# Patient Record
Sex: Female | Born: 1980 | Race: Black or African American | Hispanic: No | Marital: Married | Smoking: Never smoker
Health system: Southern US, Community
[De-identification: ages and names within clinical notes are randomized; demographics above are authoritative.]

## PROBLEM LIST (undated history)

## (undated) DIAGNOSIS — Z789 Other specified health status: Secondary | ICD-10-CM

## (undated) HISTORY — PX: TONSILLECTOMY: SUR1361

---

## 2009-01-24 ENCOUNTER — Inpatient Hospital Stay (HOSPITAL_COMMUNITY): Admission: AD | Admit: 2009-01-24 | Discharge: 2009-01-24 | Payer: Self-pay | Admitting: Obstetrics and Gynecology

## 2009-02-13 ENCOUNTER — Inpatient Hospital Stay (HOSPITAL_COMMUNITY): Admission: AD | Admit: 2009-02-13 | Discharge: 2009-02-16 | Payer: Self-pay | Admitting: Obstetrics and Gynecology

## 2009-03-22 ENCOUNTER — Ambulatory Visit: Admission: RE | Admit: 2009-03-22 | Discharge: 2009-03-22 | Payer: Self-pay | Admitting: Obstetrics and Gynecology

## 2010-07-26 LAB — TYPE AND SCREEN: Antibody Screen: NEGATIVE

## 2010-07-26 LAB — CBC
HCT: 36 % (ref 36.0–46.0)
HCT: 37.4 % (ref 36.0–46.0)
Hemoglobin: 11.1 g/dL — ABNORMAL LOW (ref 12.0–15.0)
Hemoglobin: 11.9 g/dL — ABNORMAL LOW (ref 12.0–15.0)
Hemoglobin: 12.5 g/dL (ref 12.0–15.0)
MCHC: 33.5 g/dL (ref 30.0–36.0)
MCV: 96.1 fL (ref 78.0–100.0)
Platelets: 102 10*3/uL — ABNORMAL LOW (ref 150–400)
Platelets: 112 10*3/uL — ABNORMAL LOW (ref 150–400)
RBC: 3.44 MIL/uL — ABNORMAL LOW (ref 3.87–5.11)
RBC: 3.76 MIL/uL — ABNORMAL LOW (ref 3.87–5.11)
RBC: 3.83 MIL/uL — ABNORMAL LOW (ref 3.87–5.11)
RDW: 13.3 % (ref 11.5–15.5)
RDW: 13.5 % (ref 11.5–15.5)
RDW: 13.5 % (ref 11.5–15.5)
WBC: 13.8 10*3/uL — ABNORMAL HIGH (ref 4.0–10.5)
WBC: 14 10*3/uL — ABNORMAL HIGH (ref 4.0–10.5)
WBC: 17 10*3/uL — ABNORMAL HIGH (ref 4.0–10.5)

## 2010-07-26 LAB — DIFFERENTIAL
Eosinophils Absolute: 0.1 10*3/uL (ref 0.0–0.7)
Eosinophils Relative: 1 % (ref 0–5)
Lymphs Abs: 1.9 10*3/uL (ref 0.7–4.0)
Monocytes Absolute: 0.7 10*3/uL (ref 0.1–1.0)

## 2010-07-26 LAB — URINE MICROSCOPIC-ADD ON

## 2010-07-26 LAB — URINALYSIS, ROUTINE W REFLEX MICROSCOPIC
Bilirubin Urine: NEGATIVE
Specific Gravity, Urine: <1.005 — ABNORMAL LOW (ref 1.005–1.030)
Urobilinogen, UA: 0.2 mg/dL (ref 0.0–1.0)
pH: 6 (ref 5.0–8.0)

## 2010-07-26 LAB — HEPATITIS B SURFACE ANTIGEN: Hepatitis B Surface Ag: NEGATIVE

## 2010-07-26 LAB — RUBELLA SCREEN: Rubella: 31.1 IU/mL — ABNORMAL HIGH

## 2010-07-26 LAB — ABO/RH: ABO/RH(D): A POS

## 2010-07-26 LAB — PLATELET COUNT: Platelets: 105 10*3/uL — ABNORMAL LOW (ref 150–400)

## 2010-10-17 ENCOUNTER — Inpatient Hospital Stay (HOSPITAL_COMMUNITY)
Admission: AD | Admit: 2010-10-17 | Discharge: 2010-10-17 | Disposition: A | Discharge status: Home / Self Care | Payer: PRIVATE HEALTH INSURANCE | Source: Ambulatory Visit | Attending: Obstetrics and Gynecology | Admitting: Obstetrics and Gynecology

## 2010-10-17 ENCOUNTER — Inpatient Hospital Stay (HOSPITAL_COMMUNITY)
Admission: AD | Admit: 2010-10-17 | Discharge: 2010-10-19 | DRG: 775 | Disposition: A | Discharge status: Home / Self Care | Payer: PRIVATE HEALTH INSURANCE | Source: Ambulatory Visit | Attending: Obstetrics and Gynecology | Admitting: Obstetrics and Gynecology

## 2010-10-17 DIAGNOSIS — O479 False labor, unspecified: Secondary | ICD-10-CM | POA: Insufficient documentation

## 2010-10-18 LAB — CBC
Hemoglobin: 11.4 g/dL — ABNORMAL LOW (ref 12.0–15.0)
MCV: 89.5 fL (ref 78.0–100.0)
Platelets: 132 10*3/uL — ABNORMAL LOW (ref 150–400)
RBC: 3.91 MIL/uL (ref 3.87–5.11)
WBC: 23.7 10*3/uL — ABNORMAL HIGH (ref 4.0–10.5)

## 2010-10-18 LAB — RPR: RPR Ser Ql: NONREACTIVE

## 2010-10-26 ENCOUNTER — Inpatient Hospital Stay (HOSPITAL_COMMUNITY): Admission: AD | Admit: 2010-10-26 | Payer: Self-pay | Source: Ambulatory Visit | Admitting: Obstetrics and Gynecology

## 2010-11-12 NOTE — Discharge Summary (Signed)
Angela Walton, Angela Walton             ACCOUNT NO.:  [id number]  MEDICAL RECORD NO.:  [id number]  LOCATION:  9107                          FACILITY:  [facility]  PHYSICIAN:  Osborn Coho, M.D.   DATE OF BIRTH:  1980/05/31  DATE OF ADMISSION:  10/17/2010 DATE OF DISCHARGE:  10/19/2010                              DISCHARGE SUMMARY   REASON FOR ADMISSION:  Labor.  The patient is a gravida 2, para 1 at 38 weeks and 5 days, presents with urge to push.  PRENATAL PROCEDURES:  None.  ANTEPARTUM PROCEDURES:  None.  POSTPARTUM PROCEDURES:  Routine.  COMPLICATIONS:  None.  HOSPITAL COURSE:  The patient arrived in advanced 2nd stage labor and had a SVD without complications. The patient followed a routine postpartum course without complications and vital signs remained stable.  LABORATORY DATA:  The following day on October 18, 2010, laboratory included white blood cells of 23.7, hemoglobin 11.4, hematocrit 35.0, platelets of 132.  RPR was nonreactive.  DISCHARGE DIAGNOSIS:  Term pregnancy, delivered.  INSTRUCTIONS:  Per [facility] booklet.  MEDICATIONS:  Calcium, ofloxacin ophthalmic drops, prenatal vitamins, Claritin, Motrin, and Percocet.  DELIVERY INFORMATION:  The patient had a spontaneous vaginal delivery of a boy weighing 6 pounds 14 ounces without Apgars of 9 and 9.  The patient was discharged home in stable condition with instructions to follow up at [facility] in 6 weeks.    ______________________________ Sanda Klein, CNM   ______________________________ Osborn Coho, M.D.    SL/MEDQ  D:  11/11/2010  T:  11/12/2010  Job:  [id number]

## 2010-11-20 ENCOUNTER — Ambulatory Visit (HOSPITAL_COMMUNITY)
Admit: 2010-11-20 | Discharge: 2010-11-20 | Disposition: A | Discharge status: Home / Self Care | Payer: PRIVATE HEALTH INSURANCE | Source: Ambulatory Visit | Attending: Obstetrics and Gynecology | Admitting: Obstetrics and Gynecology

## 2010-11-20 NOTE — Progress Notes (Signed)
Mother has been treated for yeast x 1 week. Mother had 3 doses of Diflucan and infant has been on Nystatin for 1 week, Mother still feels burning and pain with breastfeeding. Angela Walton doesn't have any signs or symtoms of yeast. Mother nipples are slightly pink and have several small cracks on both nipples. Observed feeding on first breast, Mother needed assistance with slight position adjustment and inst on properly un-latching infant. Infant took 97 ml from first breast and took 31ml from second breast.mother informed that yeast is difficult to treat and that she may need more diflucan. Mother encouraged to call her Dr for possible treatment of Dr Butch Penny nipple cream, inst to continue treatment for yeast for 2 weeks after all symptoms are gone. Fact sheet on yeast and treatment plan given . Mother encouraged to fup with lactation next week but mother states she will call if needed.

## 2012-01-29 ENCOUNTER — Encounter (HOSPITAL_COMMUNITY): Payer: Self-pay | Admitting: Emergency Medicine

## 2012-01-29 ENCOUNTER — Emergency Department (HOSPITAL_COMMUNITY): Payer: 59

## 2012-01-29 ENCOUNTER — Emergency Department (HOSPITAL_COMMUNITY)
Admission: EM | Admit: 2012-01-29 | Discharge: 2012-01-29 | Disposition: A | Discharge status: Home / Self Care | Payer: 59 | Attending: Emergency Medicine | Admitting: Emergency Medicine

## 2012-01-29 DIAGNOSIS — S62639A Displaced fracture of distal phalanx of unspecified finger, initial encounter for closed fracture: Secondary | ICD-10-CM | POA: Insufficient documentation

## 2012-01-29 DIAGNOSIS — W230XXA Caught, crushed, jammed, or pinched between moving objects, initial encounter: Secondary | ICD-10-CM | POA: Insufficient documentation

## 2012-01-29 MED ORDER — HYDROCODONE-ACETAMINOPHEN 5-325 MG PO TABS: 1.0000 | ORAL_TABLET | Freq: Four times a day (QID) | ORAL | Status: DC | PRN

## 2012-01-29 MED ORDER — NAPROXEN 500 MG PO TABS: 500.0000 mg | ORAL_TABLET | Freq: Two times a day (BID) | ORAL | Status: DC

## 2012-01-29 MED ORDER — HYDROCODONE-ACETAMINOPHEN 5-325 MG PO TABS
1.0000 | ORAL_TABLET | Freq: Once | ORAL | Status: AC
  Administered 2012-01-29: 1 via ORAL
  Filled 2012-01-29: qty 1

## 2012-01-29 NOTE — ED Notes (Signed)
Patient transported to X-ray 

## 2012-01-29 NOTE — ED Notes (Signed)
Onset today slammed car door right fifth finger.  Pain 10/10 achy sharp. Full sensation. Radial pulse +2.

## 2012-01-29 NOTE — Progress Notes (Signed)
Orthopedic Tech Progress Note Patient Details:  Angela Walton Sep 19, 1980 [id number]  Ortho Devices Type of Ortho Device: Finger splint Ortho Device/Splint Location: left hand Ortho Device/Splint Interventions: Application   Manal Kreutzer 01/29/2012, 8:10 PM

## 2012-01-29 NOTE — ED Provider Notes (Signed)
History   This chart was scribed for Angela Kras, MD by Charolett Bumpers . The patient was seen in room TR08C/TR08C. Patient's care was started at 1841.  CSN: [id number] Arrival date & time 01/29/12  1755  First MD Initiated Contact with Patient 01/29/12 1841      Chief Complaint  Patient presents with  . Finger Injury    The history is provided by the patient. No language interpreter was used.   Hooria Gasparini is a 31 y.o. female who presents to the Emergency Department complaining of constant, severe right fifth finger pain with associated swelling. She states that her finger got slammed in a car door earlier today. She rates her pain a 10/10. She reports associated bruising under the fingernail. She denies any other injuries at this time.   History reviewed. No pertinent past medical history.  History reviewed. No pertinent past surgical history.  No family history on file.  History  Substance Use Topics  . Smoking status: Never Smoker   . Smokeless tobacco: Not on file  . Alcohol Use: No    OB History    Grav Para Term Preterm Abortions TAB SAB Ect Mult Living                  Review of Systems  Constitutional: Negative for fever and chills.  Respiratory: Negative for shortness of breath.   Gastrointestinal: Negative for nausea, vomiting and diarrhea.  Musculoskeletal: Positive for arthralgias.  Neurological: Negative for weakness.  All other systems reviewed and are negative.    Allergies  Latex  Home Medications  No current outpatient prescriptions on file.  BP 120/60  Pulse 86  Temp 98.7 F (37.1 C) (Oral)  Resp 16  SpO2 100%  LMP 01/10/2012  Physical Exam  Nursing note and vitals reviewed. Constitutional: She appears well-developed and well-nourished. No distress.  HENT:  Head: Normocephalic and atraumatic.  Right Ear: External ear normal.  Left Ear: External ear normal.  Eyes: Conjunctivae normal are normal. Right eye exhibits no  discharge. Left eye exhibits no discharge. No scleral icterus.  Neck: Neck supple. No tracheal deviation present.  Cardiovascular: Normal rate.   Pulmonary/Chest: Effort normal. No stridor. No respiratory distress.  Musculoskeletal: She exhibits tenderness. She exhibits no edema.       Tenderness to palpation of the distal phalanx of the right 5th finger. Pain with ROM. Neurovascularly intact. No edema. Small 10% subungual hematoma.   Neurological: She is alert. Cranial nerve deficit: no gross deficits.  Skin: Skin is warm and dry. No rash noted.  Psychiatric: She has a normal mood and affect.    ED Course  Procedures (including critical care time)  DIAGNOSTIC STUDIES: Oxygen Saturation is 100% on room air, normal by my interpretation.    COORDINATION OF CARE:   19:12-Informed pt of imaging results of fracture of the tuft of the right fifth distal phalanx. Discussed planned course of treatment with the patient including splinting the finger and pain medication, who is agreeable at this time. Discussed f/u with orthopedics if symptoms do not improve.   19:15-Medication Orders: Hydrocodone-acetaminophen (Norco/Vicodin) 5-325 mg per tablet 1 tablet-once.    Labs Reviewed - No data to display Dg Finger Little Right  01/29/2012  *RADIOLOGY REPORT*  Clinical Data: Injury.  RIGHT LITTLE FINGER 2+V  Comparison: None.  Findings: Fracture of the tuft of the right fifth finger distal phalanx.  IMPRESSION: Fracture of the tuft of the right fifth finger distal phalanx.  Original Report Authenticated By: Fuller Canada, M.D.      1. Fracture of distal phalanx of finger       MDM  Patient was placed in a splint by the orthopedic tech.  I will give her a referral to Dr. Mina Marble.  I explained to the patient that she will likely not require any further intervention and as long as she is healing as expected she can treat this with the finger splint.   I personally performed the services  described in this documentation, which was scribed in my presence.  The recorded information has been reviewed and considered.    Angela Kras, MD 01/29/12 [phone]

## 2013-04-02 IMAGING — CR DG FINGER LITTLE 2+V*R*
3 series · 3 of 3 positions shown · non-contrast
Comparison: None.

CLINICAL DATA: Injury.

RIGHT LITTLE FINGER 2+V

[x finger pa right]
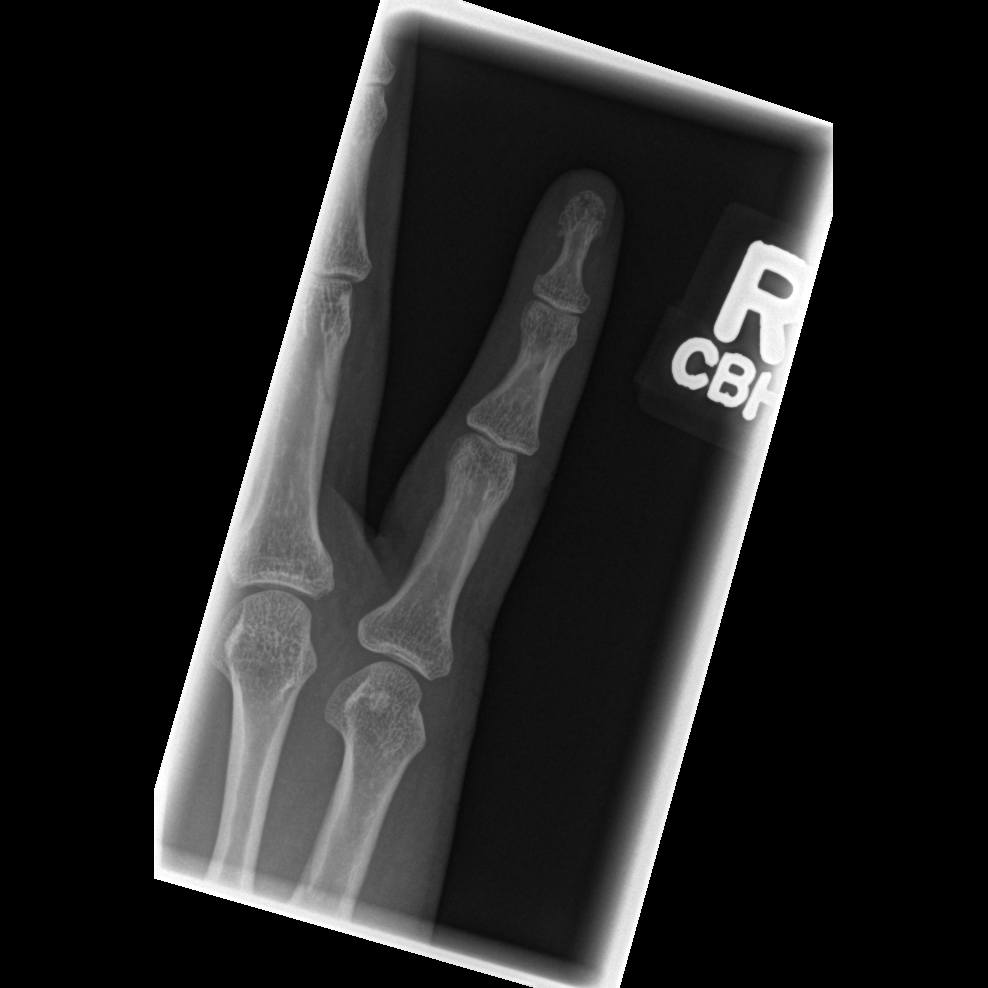

[x finger obl. right]
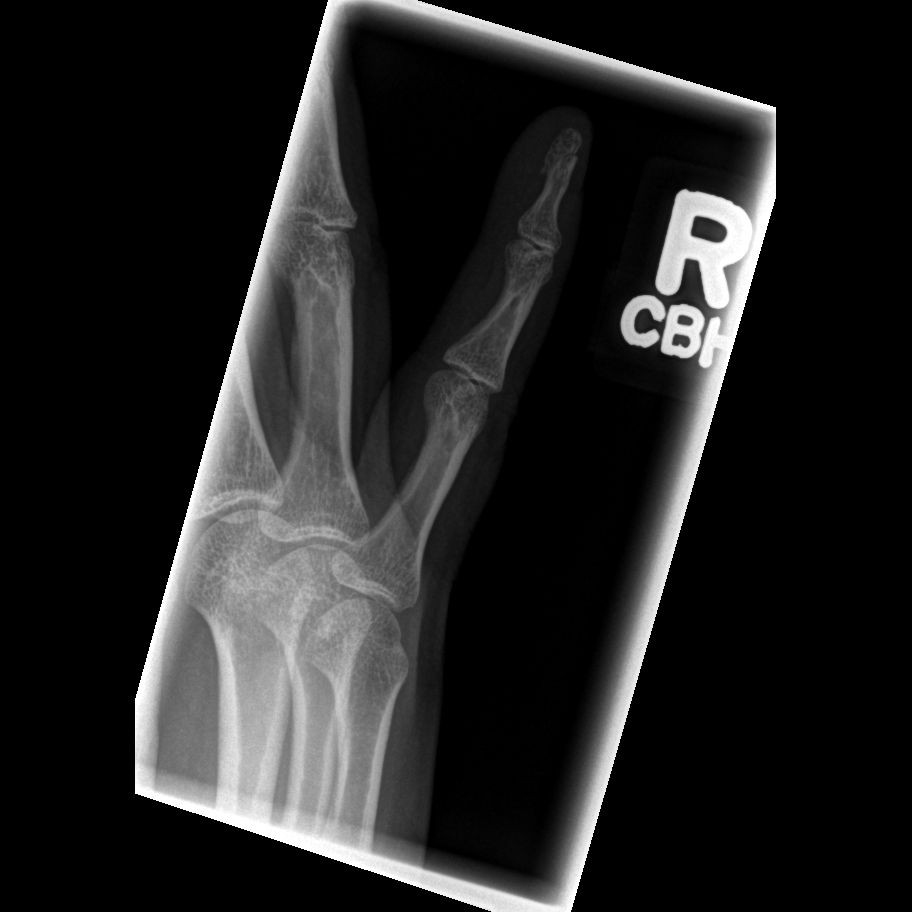

[x finger lateral right]
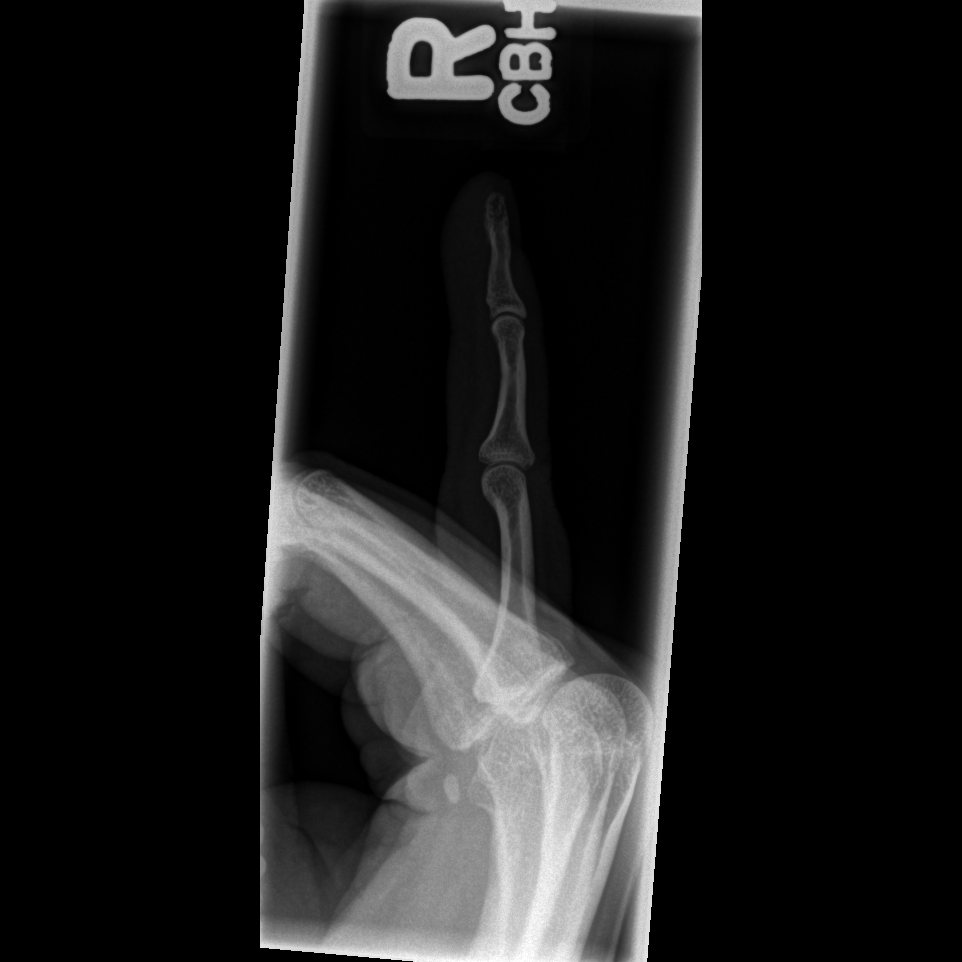

[3 of 3 positions shown; findings below may reference images not displayed]

FINDINGS: Fracture of the tuft of the right fifth finger distal
phalanx.
IMPRESSION: Fracture of the tuft of the right fifth finger distal phalanx.]

## 2013-07-17 ENCOUNTER — Emergency Department (INDEPENDENT_AMBULATORY_CARE_PROVIDER_SITE_OTHER)
Admission: EM | Admit: 2013-07-17 | Discharge: 2013-07-17 | Disposition: A | Discharge status: Home / Self Care | Payer: 59 | Source: Home / Self Care

## 2013-07-17 ENCOUNTER — Encounter (HOSPITAL_COMMUNITY): Payer: Self-pay | Admitting: Emergency Medicine

## 2013-07-17 DIAGNOSIS — N83209 Unspecified ovarian cyst, unspecified side: Secondary | ICD-10-CM

## 2013-07-17 DIAGNOSIS — N83201 Unspecified ovarian cyst, right side: Secondary | ICD-10-CM

## 2013-07-17 LAB — POCT URINALYSIS DIP (DEVICE)
Bilirubin Urine: NEGATIVE
GLUCOSE, UA: NEGATIVE mg/dL
HGB URINE DIPSTICK: NEGATIVE
Ketones, ur: NEGATIVE mg/dL
Leukocytes, UA: NEGATIVE
NITRITE: NEGATIVE
PH: 6 (ref 5.0–8.0)
PROTEIN: NEGATIVE mg/dL
Specific Gravity, Urine: 1.025 (ref 1.005–1.030)
UROBILINOGEN UA: 0.2 mg/dL (ref 0.0–1.0)

## 2013-07-17 LAB — POCT PREGNANCY, URINE: Preg Test, Ur: NEGATIVE

## 2013-07-17 MED ORDER — IBUPROFEN 600 MG PO TABS: 600.0000 mg | ORAL_TABLET | Freq: Four times a day (QID) | ORAL | Status: DC | PRN

## 2013-07-17 NOTE — Discharge Instructions (Signed)

## 2013-07-17 NOTE — ED Provider Notes (Signed)
CSN: [id number]     Arrival date & time 07/17/13  1253 History   First MD Initiated Contact with Patient 07/17/13 1350     Chief Complaint  Patient presents with  . Abdominal Pain   (Consider location/radiation/quality/duration/timing/severity/associated sxs/prior Treatment) HPI Comments: 33 year old female presents with discomfort to the right pelvis. As above the patient has been diagnosed with a right ovarian cyst recently. She underwent an MRI as well as 2 pelvic ultrasounds. The second ultrasound performed 5 days ago demonstrated that the cyst had resolved. She was told that she may have discomfort for about 3 more days. The patient is concerned that she continues to have a dull throbbing discomfort in the right pelvis particularly with sitting or leaning forward. While in a meeting today in stating the pain became more moderate and less tolerable. She is able to lessen the discomfort with lying supine or even standing. She has had no fever, chills, nausea, vomiting, diarrhea or abdominal pain. Denies vaginal discharge. Patency test is negative.   History reviewed. No pertinent past medical history. Past Surgical History  Procedure Laterality Date  . Tonsillectomy     No family history on file. History  Substance Use Topics  . Smoking status: Never Smoker   . Smokeless tobacco: Not on file  . Alcohol Use: Yes   OB History   Grav Para Term Preterm Abortions TAB SAB Ect Mult Living                 Review of Systems  Respiratory: Negative.   Gastrointestinal: Negative.   Genitourinary: Positive for pelvic pain. Negative for dysuria, frequency, flank pain, vaginal bleeding, vaginal discharge, vaginal pain and menstrual problem.  Musculoskeletal: Negative.   Neurological: Negative.     Allergies  Latex  Home Medications   Current Outpatient Rx  Name  Route  Sig  Dispense  Refill  . OVER THE COUNTER MEDICATION      steroids         . HYDROcodone-acetaminophen (NORCO)  5-325 MG per tablet   Oral   Take 1-2 tablets by mouth every 6 (six) hours as needed for pain.   16 tablet   0   . ibuprofen (ADVIL,MOTRIN) 600 MG tablet   Oral   Take 1 tablet (600 mg total) by mouth every 6 (six) hours as needed.   20 tablet   0   . naproxen (NAPROSYN) 500 MG tablet   Oral   Take 1 tablet (500 mg total) by mouth 2 (two) times daily.   30 tablet   0    BP 102/69  Pulse 70  Temp(Src) 98.8 F (37.1 C) (Oral)  Resp 17  SpO2 100%  LMP 07/08/2013 Physical Exam  Nursing note and vitals reviewed. Constitutional: She is oriented to person, place, and time. She appears well-developed and well-nourished. No distress.  Neck: Neck supple.  Cardiovascular: Normal rate.   Pulmonary/Chest: Effort normal. No respiratory distress.  Abdominal: Soft. She exhibits no distension and no mass. There is no tenderness. There is no rebound and no guarding.  Genitourinary:  Ventral abdominal pelvic exam reveals a right palpable ovary measuring approximately 2-1/2 cm in length. This is the area of discomfort and direct tenderness. The ovary is mobile. No other palpable associated masses. No swelling or distention.  Musculoskeletal: She exhibits no edema and no tenderness.  Neurological: She is alert and oriented to person, place, and time.  Skin: Skin is warm and dry.  Psychiatric: She has a normal  mood and affect.    ED Course  Procedures (including critical care time) Labs Review Labs Reviewed  POCT URINALYSIS DIP (DEVICE)  POCT PREGNANCY, URINE   Imaging Review No results found. Results for orders placed during the hospital encounter of 07/17/13  POCT URINALYSIS DIP (DEVICE)      Result Value Ref Range   Glucose, UA NEGATIVE  NEGATIVE mg/dL   Bilirubin Urine NEGATIVE  NEGATIVE   Ketones, ur NEGATIVE  NEGATIVE mg/dL   Specific Gravity, Urine 1.025  1.005 - 1.030   Hgb urine dipstick NEGATIVE  NEGATIVE   pH 6.0  5.0 - 8.0   Protein, ur NEGATIVE  NEGATIVE mg/dL    Urobilinogen, UA 0.2  0.0 - 1.0 mg/dL   Nitrite NEGATIVE  NEGATIVE   Leukocytes, UA NEGATIVE  NEGATIVE  POCT PREGNANCY, URINE      Result Value Ref Range   Preg Test, Ur NEGATIVE  NEGATIVE     MDM   1. Ovarian cyst, right    Possible ovarian cyst versus residual inflammation or local tissue irritation from ruptured cystic fluid and/or blood. H and P  Is not suggestive of appendicitis, colonic pain, infection. Pregnancy test neg. No GU sx's.  No acute abdomen or systemic sx's. Will rx motrin 600 mg q 8h prn pain If worse, fever, [facility]'s [facility]    Mykell Genao, NP 07/17/13 1422

## 2013-07-17 NOTE — ED Provider Notes (Signed)
Medical screening examination/treatment/procedure(s) were performed by non-physician practitioner and as supervising physician I was immediately available for consultation/collaboration.  Leslee Homeavid Jesson Foskey, M.D.  Reuben Likesavid C Janat Tabbert, MD 07/17/13 2155

## 2013-07-17 NOTE — ED Notes (Signed)
Right, lower abdominal pain.  Patient reports pain in this area has been going on since last months and started with her period last month.  Reports last months period was "odd" and she just didn't feel well.  Had an u/s, noted mass on ovary, then went for mri.  Told there was a cyst on ovary, wait 6 weeks and have a follow up appointment.  appt was last Monday 3/23.  Had a repeat u/s and told cyst was gone.  Patient has had ongoing pain in this particular area, but has worsened since Wednesday. Called specialist and pcp and told to come here.  Last month pain was described as "cramping" today it is a "dull, throbbing pain"

## 2013-10-06 ENCOUNTER — Encounter (HOSPITAL_COMMUNITY): Payer: Self-pay | Admitting: *Deleted

## 2013-10-06 ENCOUNTER — Inpatient Hospital Stay (HOSPITAL_COMMUNITY)
Admission: AD | Admit: 2013-10-06 | Discharge: 2013-10-06 | Disposition: A | Discharge status: Home / Self Care | Payer: 59 | Source: Ambulatory Visit | Attending: Obstetrics & Gynecology | Admitting: Obstetrics & Gynecology

## 2013-10-06 ENCOUNTER — Inpatient Hospital Stay (HOSPITAL_COMMUNITY): Payer: 59

## 2013-10-06 DIAGNOSIS — O209 Hemorrhage in early pregnancy, unspecified: Secondary | ICD-10-CM

## 2013-10-06 DIAGNOSIS — O26859 Spotting complicating pregnancy, unspecified trimester: Secondary | ICD-10-CM | POA: Insufficient documentation

## 2013-10-06 HISTORY — DX: Other specified health status: Z78.9

## 2013-10-06 LAB — WET PREP, GENITAL
Clue Cells Wet Prep HPF POC: NONE SEEN
TRICH WET PREP: NONE SEEN
YEAST WET PREP: NONE SEEN

## 2013-10-06 LAB — CBC
HEMATOCRIT: 37.7 % (ref 36.0–46.0)
Hemoglobin: 12.5 g/dL (ref 12.0–15.0)
MCH: 29.9 pg (ref 26.0–34.0)
MCHC: 33.2 g/dL (ref 30.0–36.0)
MCV: 90.2 fL (ref 78.0–100.0)
Platelets: 157 10*3/uL (ref 150–400)
RBC: 4.18 MIL/uL (ref 3.87–5.11)
RDW: 12.4 % (ref 11.5–15.5)
WBC: 11.1 10*3/uL — AB (ref 4.0–10.5)

## 2013-10-06 LAB — HIV ANTIBODY (ROUTINE TESTING W REFLEX): HIV 1&2 Ab, 4th Generation: NONREACTIVE

## 2013-10-06 LAB — HCG, QUANTITATIVE, PREGNANCY: HCG, BETA CHAIN, QUANT, S: 185 m[IU]/mL — AB (ref <5)

## 2013-10-06 LAB — POCT PREGNANCY, URINE: Preg Test, Ur: POSITIVE — AB

## 2013-10-06 NOTE — MAU Note (Signed)
last night had some spotting and again today. Called dr.  Today bleeding increased, no longer just brown and more like a period.

## 2013-10-06 NOTE — Discharge Instructions (Signed)
Vaginal Bleeding During Pregnancy, First Trimester  A small amount of bleeding (spotting) from the vagina is relatively common in early pregnancy. It usually stops on its own. Various things may cause bleeding or spotting in early pregnancy. Some bleeding may be related to the pregnancy, and some may not. In most cases, the bleeding is normal and is not a problem. However, bleeding can also be a sign of something serious. Be sure to tell your health care provider about any vaginal bleeding right away.  Some possible causes of vaginal bleeding during the first trimester include:  · Infection or inflammation of the cervix.  · Growths (polyps) on the cervix.  · Miscarriage or threatened miscarriage.  · Pregnancy tissue has developed outside of the uterus and in a fallopian tube (tubal pregnancy).  · Tiny cysts have developed in the uterus instead of pregnancy tissue (molar pregnancy).  HOME CARE INSTRUCTIONS   Watch your condition for any changes. The following actions may help to lessen any discomfort you are feeling:  · Follow your health care provider's instructions for limiting your activity. If your health care provider orders bed rest, you may need to stay in bed and only get up to use the bathroom. However, your health care provider may allow you to continue light activity.  · If needed, make plans for someone to help with your regular activities and responsibilities while you are on bed rest.  · Keep track of the number of pads you use each day, how often you change pads, and how soaked (saturated) they are. Write this down.  · Do not use tampons. Do not douche.  · Do not have sexual intercourse or orgasms until approved by your health care provider.  · If you pass any tissue from your vagina, save the tissue so you can show it to your health care provider.  · Only take over-the-counter or prescription medicines as directed by your health care provider.  · Do not take aspirin because it can make you  bleed.  · Keep all follow-up appointments as directed by your health care provider.  SEEK MEDICAL CARE IF:  · You have any vaginal bleeding during any part of your pregnancy.  · You have cramps or labor pains.  · You have a fever, not controlled by medicine.  SEEK IMMEDIATE MEDICAL CARE IF:   · You have severe cramps in your back or belly (abdomen).  · You pass large clots or tissue from your vagina.  · Your bleeding increases.  · You feel light-headed or weak, or you have fainting episodes.  · You have chills.  · You are leaking fluid or have a gush of fluid from your vagina.  · You pass out while having a bowel movement.  MAKE SURE YOU:  · Understand these instructions.  · Will watch your condition.  · Will get help right away if you are not doing well or get worse.  Document Released: 01/16/2005 Document Revised: 04/13/2013 Document Reviewed: 12/14/2012  ExitCare® Patient Information ©2015 [employer]. This information is not intended to replace advice given to you by your health care provider. Make sure you discuss any questions you have with your health care provider.

## 2013-10-06 NOTE — MAU Provider Note (Signed)
History     CSN: [id number]  Arrival date and time: 10/06/13 1639   None     No chief complaint on file.  HPI This is a 33 y.o. female at 8675w5d who presents with c/o spotting. No pain.  RN Note: last night had some spotting and again today. Called dr. Today bleeding increased, no longer just brown and more like a period.       OB History   Grav Para Term Preterm Abortions TAB SAB Ect Mult Living   3 2 2       2       No past medical history on file.  Past Surgical History  Procedure Laterality Date  . Tonsillectomy      No family history on file.  History  Substance Use Topics  . Smoking status: Never Smoker   . Smokeless tobacco: Not on file  . Alcohol Use: Yes    Allergies:  Allergies  Allergen Reactions  . Latex     Prescriptions prior to admission  Medication Sig Dispense Refill  . HYDROcodone-acetaminophen (NORCO) 5-325 MG per tablet Take 1-2 tablets by mouth every 6 (six) hours as needed for pain.  16 tablet  0  . ibuprofen (ADVIL,MOTRIN) 600 MG tablet Take 1 tablet (600 mg total) by mouth every 6 (six) hours as needed.  20 tablet  0  . naproxen (NAPROSYN) 500 MG tablet Take 1 tablet (500 mg total) by mouth 2 (two) times daily.  30 tablet  0  . OVER THE COUNTER MEDICATION steroids        Review of Systems  Constitutional: Negative for fever and malaise/fatigue.  Gastrointestinal: Negative for nausea, vomiting and abdominal pain.  Neurological: Negative for dizziness.   Physical Exam   Blood pressure 128/74, pulse 92, temperature 99.1 F (37.3 C), temperature source Oral, resp. rate 18, height 5' 4.5" (1.638 m), weight 67.495 kg (148 lb 12.8 oz), last menstrual period 08/22/2013, SpO2 100.00%.  Physical Exam  Constitutional: She is oriented to person, place, and time. She appears well-developed and well-nourished. No distress.  Cardiovascular: Normal rate.   Respiratory: Effort normal.  GI: Soft. She exhibits no distension. There is no  tenderness. There is no rebound and no guarding.  Genitourinary: Uterus normal. Vaginal discharge (scant dark red, cervix closed, uterus very small) found.  adnexae nontender   Musculoskeletal: Normal range of motion.  Neurological: She is alert and oriented to person, place, and time.  Skin: Skin is warm and dry.  Psychiatric: She has a normal mood and affect.    MAU Course  Procedures  MDM Results for orders placed during the hospital encounter of 10/06/13 (from the past 72 hour(s))  POCT PREGNANCY, URINE     Status: Abnormal   Collection Time    10/06/13  5:10 PM      Result Value Ref Range   Preg Test, Ur POSITIVE (*) NEGATIVE   Comment:            THE SENSITIVITY OF THIS     METHODOLOGY IS >24 mIU/mL  CBC     Status: Abnormal   Collection Time    10/06/13  5:23 PM      Result Value Ref Range   WBC 11.1 (*) 4.0 - 10.5 K/uL   RBC 4.18  3.87 - 5.11 MIL/uL   Hemoglobin 12.5  12.0 - 15.0 g/dL   HCT 29.537.7  28.436.0 - 13.246.0 %   MCV 90.2  78.0 - 100.0 fL  MCH 29.9  26.0 - 34.0 pg   MCHC 33.2  30.0 - 36.0 g/dL   RDW 16.112.4  09.611.5 - 04.515.5 %   Platelets 157  150 - 400 K/uL  HCG, QUANTITATIVE, PREGNANCY     Status: Abnormal   Collection Time    10/06/13  5:23 PM      Result Value Ref Range   hCG, Beta Chain, Quant, S 185 (*) <5 mIU/mL   Comment:              GEST. AGE      CONC.  (mIU/mL)       <=1 WEEK        5 - 50         2 WEEKS       50 - 500         3 WEEKS       100 - 10,000         4 WEEKS     1,000 - 30,000         5 WEEKS     3,500 - 115,000       6-8 WEEKS     12,000 - 270,000        12 WEEKS     15,000 - 220,000                FEMALE AND NON-PREGNANT FEMALE:         LESS THAN 5 mIU/mL  HIV ANTIBODY (ROUTINE TESTING)     Status: None   Collection Time    10/06/13  5:23 PM      Result Value Ref Range   HIV 1&2 Ab, 4th Generation NONREACTIVE  NONREACTIVE   Comment: (NOTE)     A NONREACTIVE HIV Ag/Ab result does not exclude HIV infection since     the time frame for  seroconversion is variable. If acute HIV infection     is suspected, a HIV-1 RNA Qualitative TMA test is recommended.     HIV-1/2 Antibody Diff         Not indicated.     HIV-1 RNA, Qual TMA           Not indicated.     PLEASE NOTE: This information has been disclosed to you from records     whose confidentiality may be protected by state law. If your state     requires such protection, then the state law prohibits you from making     any further disclosure of the information without the specific written     consent of the person to whom it pertains, or as otherwise permitted     by law. A general authorization for the release of medical or other     information is NOT sufficient for this purpose.     The performance of this assay has not been clinically validated in     patients less than 32 years old.     Performed at [facility]  WET PREP, GENITAL     Status: Abnormal   Collection Time    10/06/13  6:30 PM      Result Value Ref Range   Yeast Wet Prep HPF POC NONE SEEN  NONE SEEN   Trich, Wet Prep NONE SEEN  NONE SEEN   Clue Cells Wet Prep HPF POC NONE SEEN  NONE SEEN   WBC, Wet Prep HPF POC FEW (*) NONE SEEN   Comment: MODERATE BACTERIA SEEN  US Ob Transvaginal  10/06/2013   CLINICAL DATA:  Vaginal spotting  EXAM: TRANSVAGINAL OB ULTRASOUND  TECHNIQUE: Transvaginal ultrasound was performed for complete evaluation of the gestation as well as the maternal uterus, adnexal regions, and pelvic cul-de-sac.  COMPARISON:  None.    FINDINGS: Intrauterine gestational sac: Visualized/normal in shape.  Yolk sac:  Not visualized  Embryo:  Not visualized  MSD: 4 mm      4 w   6  d  [place] [facility]: June 09, 2014  Maternal uterus/adnexae: Within the maternal uterus, there is a hypoechoic mass in the fundus measuring 1.6 x 1.8 x 2.0 cm consistent with a leiomyoma. In the sub serosal region, there is a calcified small mass measuring 0.7 x 0.7 x 0.7 cm, felt to represent a small calcified leiomyoma.  There is a probable small corpus luteum in the left ovary measuring 1.1 x 1.0 cm. Maternal ovaries otherwise appear normal bilaterally. There is no maternal free pelvic fluid.    IMPRESSION: Probable early intrauterine gestational sac, but no yolk sac, fetal pole, or cardiac activity yet visualized.   Recommend follow-up quantitative B-HCG levels and follow-up US in 14 days to confirm and assess viability. This recommendation follows SRU consensus guidelines: Diagnostic Criteria for Nonviable Pregnancy Early in the First Trimester. Malva Limes Med 2013; 119:1478-29.    There are 2 small leiomyomas within the maternal uterus. Probable small corpus luteum in the left ovary.     Electronically Signed   By: Bretta Bang M.D.   On: 10/06/2013 18:23    Assessment and Plan  A: Pregnancy at [redacted]w[redacted]d by LMP      Low HCG, nothing seen on [place]       P:  Discharge       Needs repeat Quant in 48 hrs       Will probably switch from [facility] to a practice here, since she lives in [place].        Wants to do F/U lab here on Friday. Will schedule in clinic 8-11am.  Wynelle Bourgeois 10/06/2013, 6:37 PM

## 2013-10-08 ENCOUNTER — Other Ambulatory Visit: Payer: 59

## 2013-10-08 DIAGNOSIS — N925 Other specified irregular menstruation: Secondary | ICD-10-CM

## 2013-10-08 DIAGNOSIS — N949 Unspecified condition associated with female genital organs and menstrual cycle: Secondary | ICD-10-CM

## 2013-10-08 DIAGNOSIS — N938 Other specified abnormal uterine and vaginal bleeding: Secondary | ICD-10-CM

## 2013-10-08 LAB — HCG, QUANTITATIVE, PREGNANCY: hCG, Beta Chain, Quant, S: 96.8 m[IU]/mL

## 2013-10-09 LAB — GC/CHLAMYDIA PROBE AMP
CT PROBE, AMP APTIMA: NEGATIVE
GC Probe RNA: NEGATIVE

## 2013-10-11 ENCOUNTER — Telehealth: Payer: Self-pay | Admitting: General Practice

## 2013-10-11 NOTE — MAU Provider Note (Signed)
Attestation of Attending Supervision of Advanced Practitioner: Evaluation and management procedures were performed by the PA/NP/CNM/OB Fellow under my supervision/collaboration. Chart reviewed and agree with management and plan.  FERGUSON,JOHN V 10/11/2013 3:08 AM

## 2013-10-11 NOTE — Telephone Encounter (Signed)
Patient called and left message stating she would like the results from her bhcg so she can find out if the pregnancy is viable or if she's having a miscarriage. Called patient back and she stated she already contacted the ER on Friday and got her results. Patient had no questions

## 2014-02-21 ENCOUNTER — Encounter (HOSPITAL_COMMUNITY): Payer: Self-pay | Admitting: *Deleted

## 2014-08-11 ENCOUNTER — Encounter (HOSPITAL_COMMUNITY): Payer: Self-pay | Admitting: *Deleted

## 2014-12-09 IMAGING — US US OB TRANSVAGINAL
1 series · 13 of 28 positions shown · non-contrast
Comparison: None.

CLINICAL DATA: Vaginal spotting

EXAM:
TRANSVAGINAL OB ULTRASOUND
TECHNIQUE: Transvaginal ultrasound was performed for complete evaluation of the
gestation as well as the maternal uterus, adnexal regions, and
pelvic cul-de-sac.

[Series 1: us ob transvaginal · 41 acquisitions, 13 frames shown]
[im 2/41]
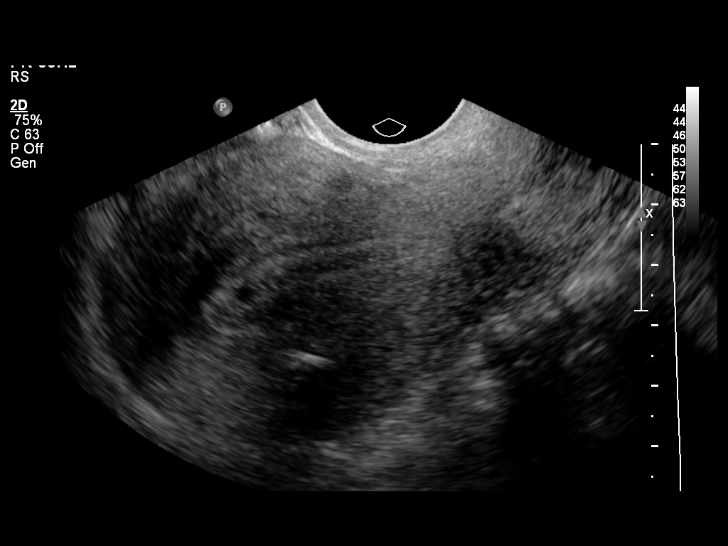
[im 5/41]
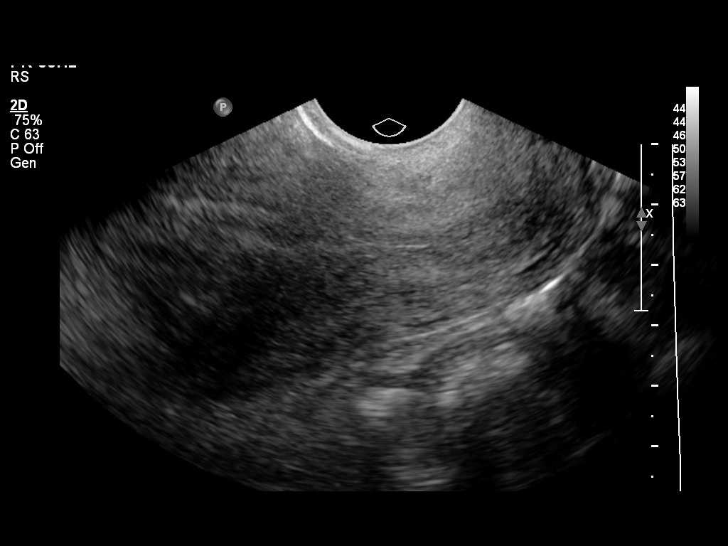
[im 8/41]
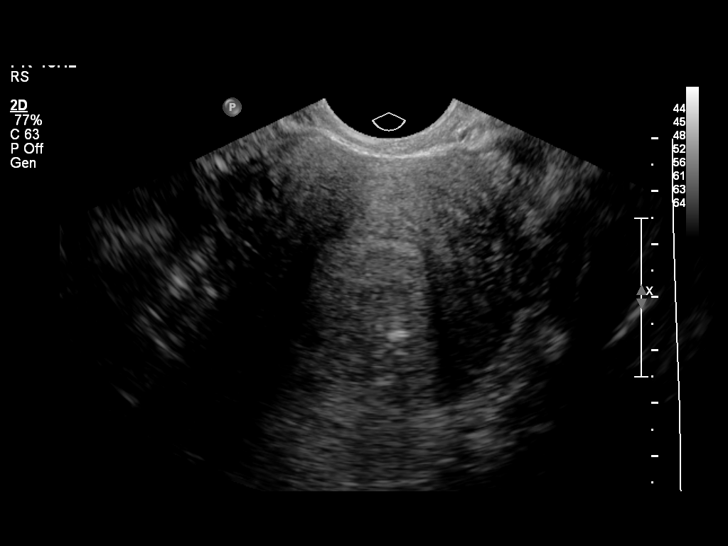
[im 11/41]
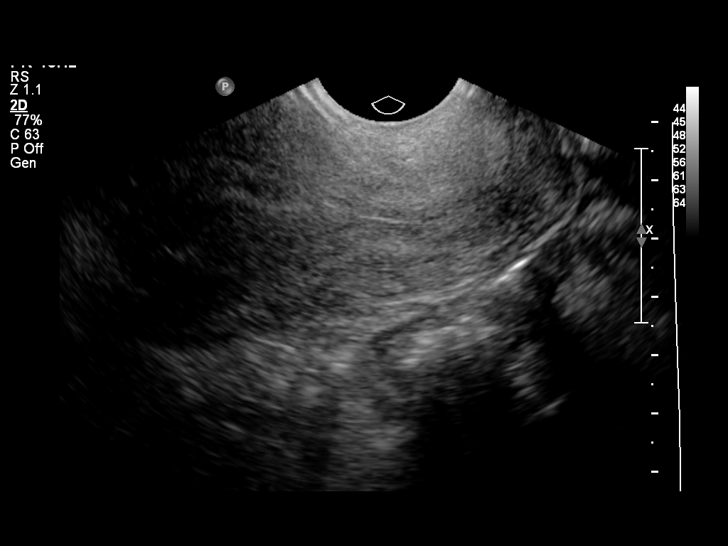
[im 14/41]
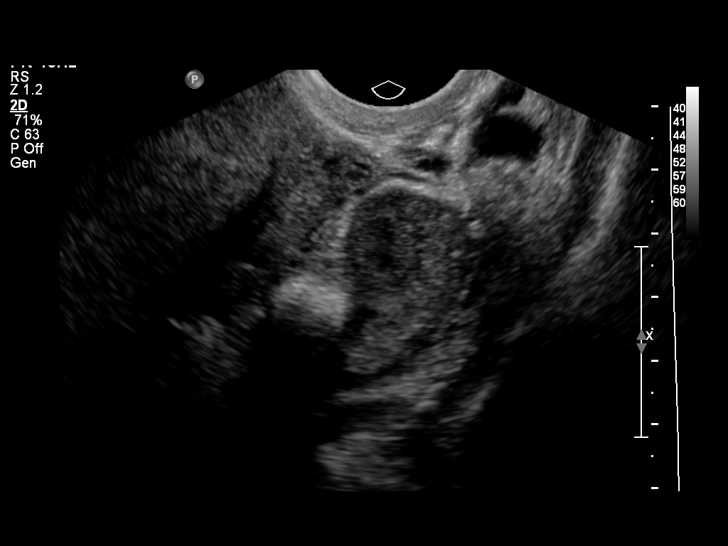
[im 17/41]
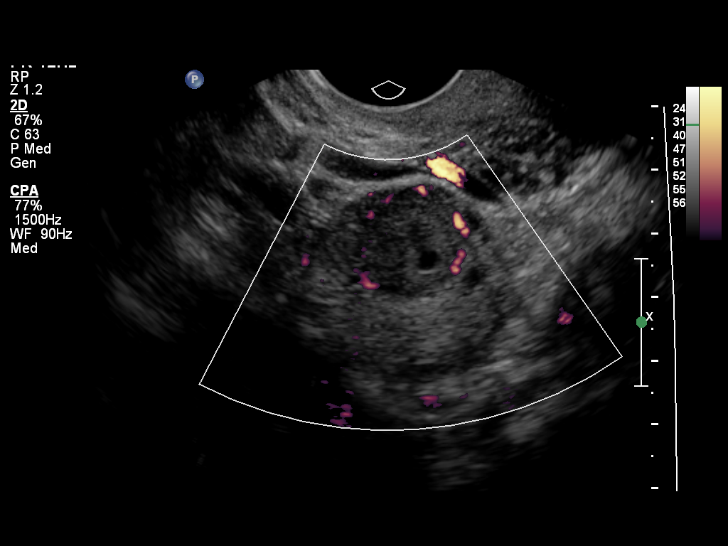
[im 21/41]
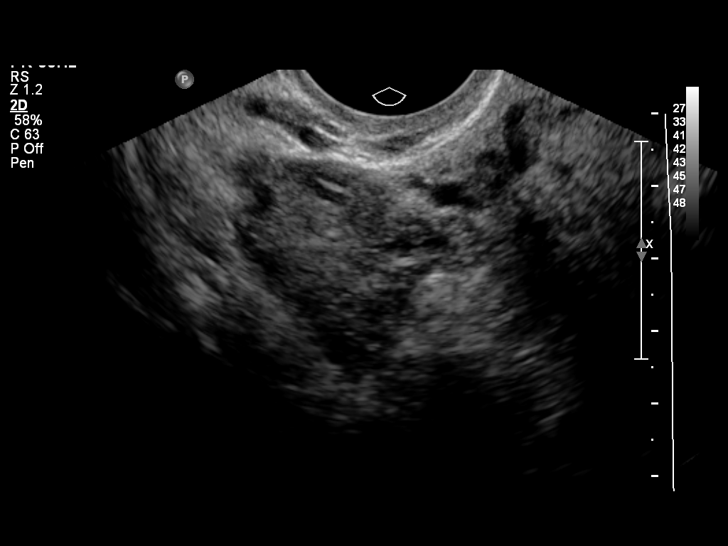
[im 24/41]
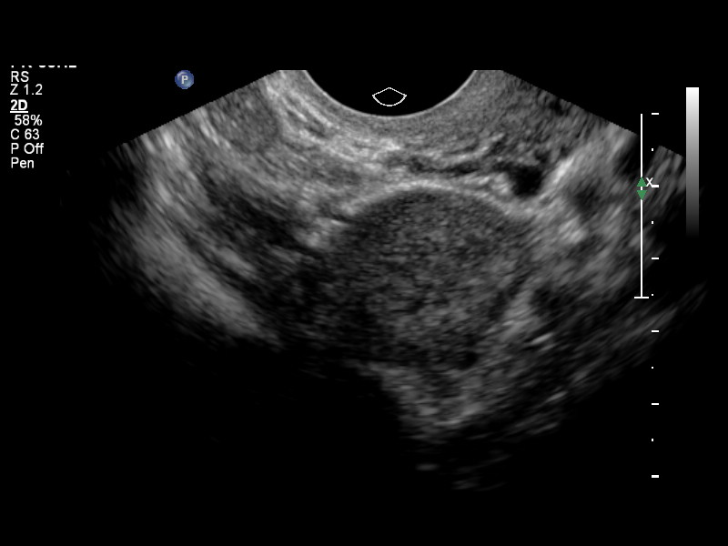
[im 27/41]
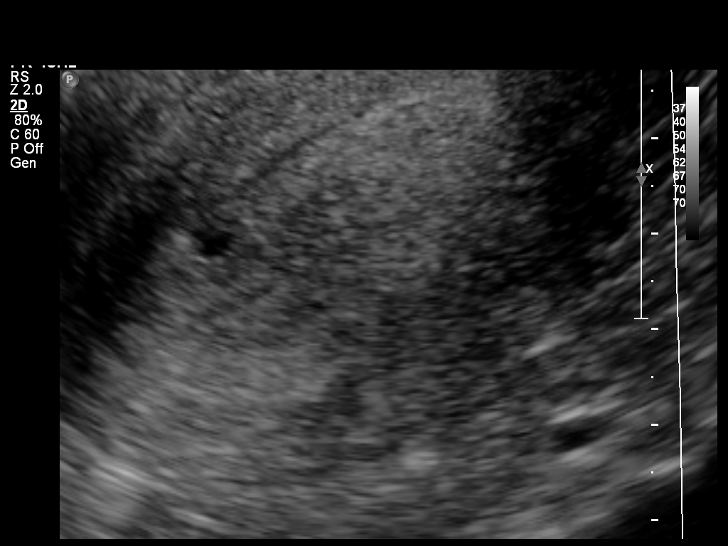
[im 30/41]
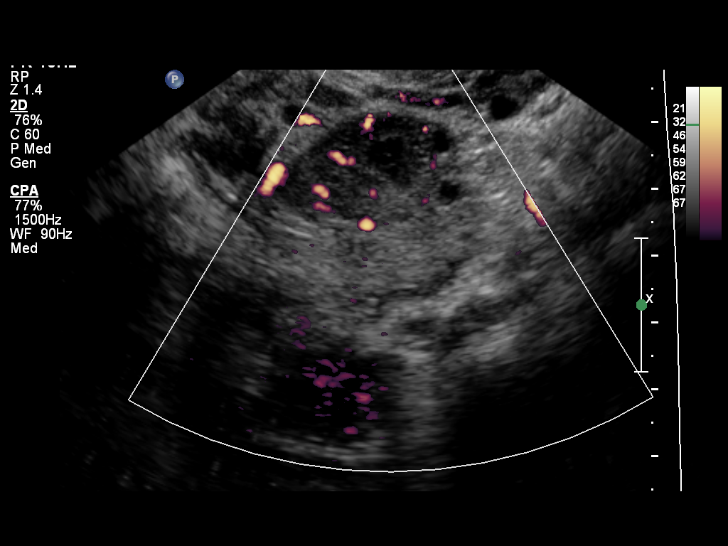
[im 33/41]
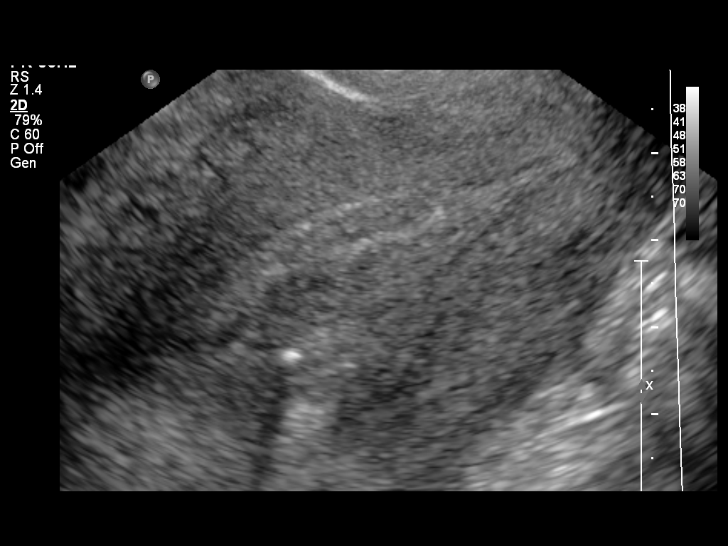
[im 36/41]
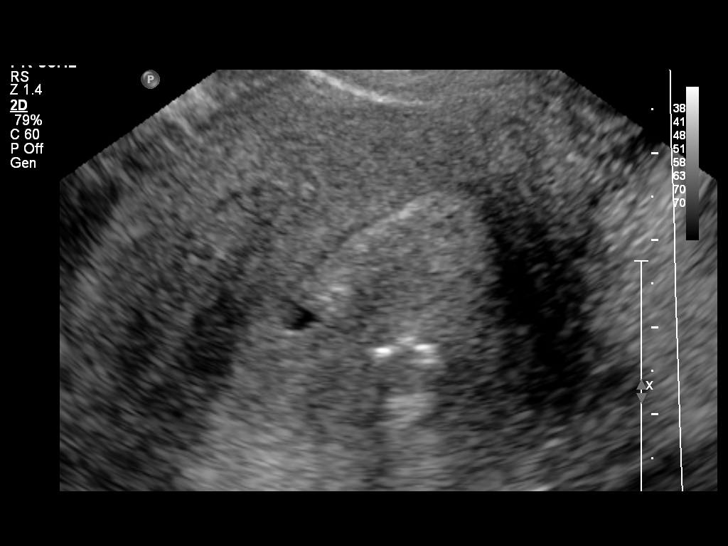
[im 39/41]
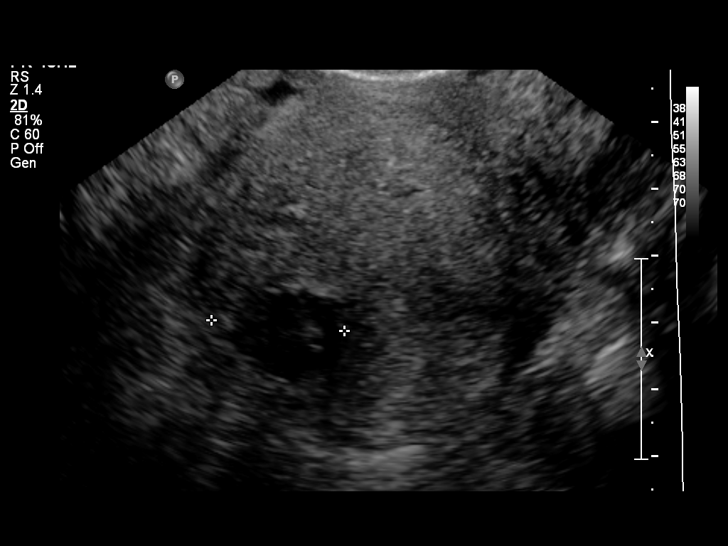

[13 of 28 positions shown; findings below may reference images not displayed]

FINDINGS: Intrauterine gestational sac: Visualized/normal in shape.

Yolk sac:  Not visualized

Embryo:  Not visualized

MSD: 4 mm      4 w   6  d

US EDC: June 09, 2014

Maternal uterus/adnexae: Within the maternal uterus, there is a
hypoechoic mass in the fundus measuring 1.6 x 1.8 x 2.0 cm
consistent with a leiomyoma. In the sub serosal region, there is a
calcified small mass measuring 0.7 x 0.7 x 0.7 cm, felt to represent
a small calcified leiomyoma. There is a probable small corpus luteum
in the left ovary measuring 1.1 x 1.0 cm. Maternal ovaries otherwise
appear normal bilaterally. There is no maternal free pelvic fluid.
IMPRESSION: Probable early intrauterine gestational sac, but no yolk sac, fetal
pole, or cardiac activity yet visualized. Recommend follow-up
quantitative B-HCG levels and follow-up US in 14 days to confirm and
assess viability. This recommendation follows [employer] consensus
guidelines: Diagnostic Criteria for Nonviable Pregnancy Early in the
First Trimester. N Engl J Med 0642; [DATE].

There are 2 small leiomyomas within the maternal uterus. Probable
small corpus luteum in the left ovary.
